# Patient Record
Sex: Male | Born: 1995 | Race: Black or African American | Hispanic: No | Marital: Single | Smoking: Never smoker
Health system: Southern US, Community
[De-identification: ages and names within clinical notes are randomized; demographics above are authoritative.]

## PROBLEM LIST (undated history)

## (undated) DIAGNOSIS — G43909 Migraine, unspecified, not intractable, without status migrainosus: Secondary | ICD-10-CM

## (undated) HISTORY — PX: CIRCUMCISION: SUR203

---
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
## 2016-03-12 ENCOUNTER — Ambulatory Visit: Payer: Medicaid Other | Attending: Internal Medicine | Admitting: Physical Therapy

## 2016-03-12 ENCOUNTER — Encounter: Payer: Self-pay | Admitting: Physical Therapy

## 2016-03-12 DIAGNOSIS — M25511 Pain in right shoulder: Secondary | ICD-10-CM | POA: Diagnosis present

## 2016-03-12 DIAGNOSIS — M6281 Muscle weakness (generalized): Secondary | ICD-10-CM | POA: Diagnosis present

## 2016-03-12 DIAGNOSIS — M25562 Pain in left knee: Secondary | ICD-10-CM | POA: Diagnosis present

## 2016-03-12 DIAGNOSIS — M25561 Pain in right knee: Secondary | ICD-10-CM | POA: Diagnosis present

## 2016-03-12 DIAGNOSIS — R262 Difficulty in walking, not elsewhere classified: Secondary | ICD-10-CM | POA: Insufficient documentation

## 2016-03-12 DIAGNOSIS — M25512 Pain in left shoulder: Secondary | ICD-10-CM
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
## 2016-03-27 ENCOUNTER — Ambulatory Visit: Payer: Medicaid Other | Admitting: Physical Therapy

## 2016-04-03 ENCOUNTER — Ambulatory Visit: Payer: Medicaid Other | Admitting: Physical Therapy

## 2016-04-03 DIAGNOSIS — M25511 Pain in right shoulder: Secondary | ICD-10-CM

## 2016-04-03 DIAGNOSIS — M25562 Pain in left knee: Secondary | ICD-10-CM

## 2016-04-03 DIAGNOSIS — M25561 Pain in right knee: Secondary | ICD-10-CM

## 2016-04-03 DIAGNOSIS — R262 Difficulty in walking, not elsewhere classified: Secondary | ICD-10-CM

## 2016-04-03 DIAGNOSIS — M25512 Pain in left shoulder: Secondary | ICD-10-CM

## 2016-04-03 DIAGNOSIS — M6281 Muscle weakness (generalized): Secondary | ICD-10-CM
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
## 2016-04-05 ENCOUNTER — Ambulatory Visit: Payer: Medicaid Other | Admitting: Physical Therapy

## 2016-04-05 DIAGNOSIS — M25511 Pain in right shoulder: Secondary | ICD-10-CM | POA: Diagnosis not present

## 2016-04-05 DIAGNOSIS — M6281 Muscle weakness (generalized): Secondary | ICD-10-CM

## 2016-04-05 DIAGNOSIS — M25561 Pain in right knee: Secondary | ICD-10-CM

## 2016-04-05 DIAGNOSIS — M25562 Pain in left knee: Secondary | ICD-10-CM

## 2016-04-05 DIAGNOSIS — M25512 Pain in left shoulder: Secondary | ICD-10-CM

## 2016-04-05 DIAGNOSIS — R262 Difficulty in walking, not elsewhere classified: Secondary | ICD-10-CM
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
## 2016-04-10 ENCOUNTER — Encounter: Payer: Self-pay | Admitting: Physical Therapy

## 2016-04-10 ENCOUNTER — Ambulatory Visit: Payer: Medicaid Other | Attending: Internal Medicine | Admitting: Physical Therapy

## 2016-04-10 DIAGNOSIS — R262 Difficulty in walking, not elsewhere classified: Secondary | ICD-10-CM

## 2016-04-10 DIAGNOSIS — M6281 Muscle weakness (generalized): Secondary | ICD-10-CM | POA: Diagnosis present

## 2016-04-10 DIAGNOSIS — M25511 Pain in right shoulder: Secondary | ICD-10-CM | POA: Diagnosis not present

## 2016-04-10 DIAGNOSIS — M25512 Pain in left shoulder: Secondary | ICD-10-CM | POA: Diagnosis present

## 2016-04-10 DIAGNOSIS — M25561 Pain in right knee: Secondary | ICD-10-CM

## 2016-04-10 DIAGNOSIS — M25562 Pain in left knee: Secondary | ICD-10-CM | POA: Diagnosis present
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
## 2016-04-12 ENCOUNTER — Ambulatory Visit: Payer: Medicaid Other | Admitting: Physical Therapy

## 2016-04-12 DIAGNOSIS — M25561 Pain in right knee: Secondary | ICD-10-CM

## 2016-04-12 DIAGNOSIS — R262 Difficulty in walking, not elsewhere classified: Secondary | ICD-10-CM

## 2016-04-12 DIAGNOSIS — M25562 Pain in left knee: Secondary | ICD-10-CM

## 2016-04-12 DIAGNOSIS — M25512 Pain in left shoulder: Secondary | ICD-10-CM

## 2016-04-12 DIAGNOSIS — M25511 Pain in right shoulder: Secondary | ICD-10-CM | POA: Diagnosis not present

## 2016-04-12 DIAGNOSIS — M6281 Muscle weakness (generalized): Secondary | ICD-10-CM
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
## 2016-04-17 ENCOUNTER — Encounter: Payer: Self-pay | Admitting: Physical Therapy

## 2016-04-17 ENCOUNTER — Ambulatory Visit: Payer: Medicaid Other | Admitting: Physical Therapy

## 2016-04-17 DIAGNOSIS — M25512 Pain in left shoulder: Secondary | ICD-10-CM

## 2016-04-17 DIAGNOSIS — M25562 Pain in left knee: Secondary | ICD-10-CM

## 2016-04-17 DIAGNOSIS — M25511 Pain in right shoulder: Secondary | ICD-10-CM

## 2016-04-17 DIAGNOSIS — M25561 Pain in right knee: Secondary | ICD-10-CM

## 2016-04-17 DIAGNOSIS — R262 Difficulty in walking, not elsewhere classified: Secondary | ICD-10-CM

## 2016-04-17 DIAGNOSIS — M6281 Muscle weakness (generalized): Secondary | ICD-10-CM
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
## 2016-04-19 ENCOUNTER — Ambulatory Visit: Payer: Medicaid Other | Admitting: Physical Therapy

## 2016-09-10 ENCOUNTER — Emergency Department (HOSPITAL_COMMUNITY)
Admission: EM | Admit: 2016-09-10 | Discharge: 2016-09-10 | Disposition: A | Discharge status: Home / Self Care | Payer: Medicaid Other | Attending: Emergency Medicine | Admitting: Emergency Medicine

## 2016-09-10 ENCOUNTER — Emergency Department (HOSPITAL_COMMUNITY): Payer: Medicaid Other

## 2016-09-10 ENCOUNTER — Encounter (HOSPITAL_COMMUNITY): Payer: Self-pay | Admitting: Emergency Medicine

## 2016-09-10 DIAGNOSIS — Y939 Activity, unspecified: Secondary | ICD-10-CM | POA: Insufficient documentation

## 2016-09-10 DIAGNOSIS — S199XXA Unspecified injury of neck, initial encounter: Secondary | ICD-10-CM | POA: Diagnosis not present

## 2016-09-10 DIAGNOSIS — Y999 Unspecified external cause status: Secondary | ICD-10-CM | POA: Insufficient documentation

## 2016-09-10 DIAGNOSIS — Y92410 Unspecified street and highway as the place of occurrence of the external cause: Secondary | ICD-10-CM | POA: Insufficient documentation

## 2016-09-10 DIAGNOSIS — M791 Myalgia: Secondary | ICD-10-CM | POA: Diagnosis not present

## 2016-09-10 DIAGNOSIS — S0990XA Unspecified injury of head, initial encounter: Secondary | ICD-10-CM | POA: Diagnosis not present

## 2016-09-10 DIAGNOSIS — R1030 Lower abdominal pain, unspecified: Secondary | ICD-10-CM | POA: Insufficient documentation

## 2016-09-10 DIAGNOSIS — M7918 Myalgia, other site: Secondary | ICD-10-CM

## 2016-09-10 DIAGNOSIS — M546 Pain in thoracic spine: Secondary | ICD-10-CM | POA: Diagnosis not present

## 2016-09-10 MED ORDER — IOPAMIDOL (ISOVUE-300) INJECTION 61%
INTRAVENOUS | Status: AC
  Administered 2016-09-10: 100 mL
  Filled 2016-09-10: qty 100

## 2016-09-10 MED ORDER — IBUPROFEN 800 MG PO TABS
800.0000 mg | ORAL_TABLET | Freq: Once | ORAL | Status: AC
  Administered 2016-09-10: 800 mg via ORAL
  Filled 2016-09-10: qty 1

## 2016-09-10 MED ORDER — HYDROCODONE-ACETAMINOPHEN 5-325 MG PO TABS
1.0000 | ORAL_TABLET | Freq: Once | ORAL | Status: AC
  Administered 2016-09-10: 1 via ORAL
  Filled 2016-09-10: qty 1

## 2016-09-10 MED ORDER — ONDANSETRON 4 MG PO TBDP
4.0000 mg | ORAL_TABLET | Freq: Once | ORAL | Status: AC | PRN
  Administered 2016-09-10: 4 mg via ORAL

## 2016-09-10 MED ORDER — ONDANSETRON 4 MG PO TBDP
ORAL_TABLET | ORAL | Status: AC
  Filled 2016-09-10: qty 1

## 2016-09-10 MED ORDER — IBUPROFEN 600 MG PO TABS: 600.0000 mg | ORAL_TABLET | Freq: Four times a day (QID) | ORAL | 0 refills | Status: DC | PRN

## 2016-09-10 MED ORDER — CYCLOBENZAPRINE HCL 10 MG PO TABS: 10.0000 mg | ORAL_TABLET | Freq: Three times a day (TID) | ORAL | 0 refills | Status: DC | PRN

## 2016-09-10 NOTE — Discharge Instructions (Signed)
Read the information below.  Use the prescribed medication as directed.  Please discuss all new medications with your pharmacist.  You may return to the Emergency Department at any time for worsening condition or any new symptoms that concern you.    °

## 2016-09-10 NOTE — ED Notes (Signed)
Patient transported to X-ray 

## 2016-09-10 NOTE — ED Notes (Signed)
Got patient hooked up to the monitor waiting on provider patient is resting got a warm blanket
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
## 2016-09-16 ENCOUNTER — Emergency Department (HOSPITAL_COMMUNITY)
Admission: EM | Admit: 2016-09-16 | Discharge: 2016-09-16 | Disposition: A | Discharge status: Home / Self Care | Payer: No Typology Code available for payment source | Attending: Emergency Medicine | Admitting: Emergency Medicine

## 2016-09-16 ENCOUNTER — Encounter (HOSPITAL_COMMUNITY): Payer: Self-pay | Admitting: Emergency Medicine

## 2016-09-16 DIAGNOSIS — S3991XD Unspecified injury of abdomen, subsequent encounter: Secondary | ICD-10-CM | POA: Insufficient documentation

## 2016-09-16 DIAGNOSIS — R1030 Lower abdominal pain, unspecified: Secondary | ICD-10-CM

## 2016-09-16 DIAGNOSIS — M7918 Myalgia, other site: Secondary | ICD-10-CM

## 2016-09-16 MED ORDER — OXYCODONE-ACETAMINOPHEN 5-325 MG PO TABS
1.0000 | ORAL_TABLET | Freq: Once | ORAL | Status: AC
  Administered 2016-09-16: 1 via ORAL
  Filled 2016-09-16: qty 1

## 2016-09-16 MED ORDER — METHOCARBAMOL 500 MG PO TABS: 500.0000 mg | ORAL_TABLET | Freq: Two times a day (BID) | ORAL | 0 refills | Status: DC
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
## 2017-12-03 ENCOUNTER — Emergency Department (HOSPITAL_COMMUNITY)
Admission: EM | Admit: 2017-12-03 | Discharge: 2017-12-03 | Disposition: A | Discharge status: Home / Self Care | Payer: Medicaid Other | Attending: Emergency Medicine | Admitting: Emergency Medicine

## 2017-12-03 ENCOUNTER — Encounter (HOSPITAL_COMMUNITY): Payer: Self-pay | Admitting: Emergency Medicine

## 2017-12-03 ENCOUNTER — Emergency Department (HOSPITAL_COMMUNITY): Payer: Medicaid Other

## 2017-12-03 DIAGNOSIS — Z79899 Other long term (current) drug therapy: Secondary | ICD-10-CM | POA: Diagnosis not present

## 2017-12-03 DIAGNOSIS — R05 Cough: Secondary | ICD-10-CM | POA: Diagnosis present

## 2017-12-03 DIAGNOSIS — B9789 Other viral agents as the cause of diseases classified elsewhere: Secondary | ICD-10-CM | POA: Diagnosis not present

## 2017-12-03 DIAGNOSIS — J069 Acute upper respiratory infection, unspecified: Secondary | ICD-10-CM | POA: Diagnosis not present

## 2017-12-03 MED ORDER — BENZONATATE 100 MG PO CAPS: 200.0000 mg | ORAL_CAPSULE | Freq: Three times a day (TID) | ORAL | 0 refills | Status: DC

## 2017-12-03 MED ORDER — BENZONATATE 100 MG PO CAPS
200.0000 mg | ORAL_CAPSULE | Freq: Once | ORAL | Status: AC
  Administered 2017-12-03: 200 mg via ORAL
  Filled 2017-12-03: qty 2

## 2017-12-03 NOTE — Discharge Instructions (Addendum)
Return to ED for worsening symptoms, coughing up blood, chest pain, shortness of breath, leg swelling.

## 2017-12-03 NOTE — ED Notes (Signed)
Patient transported to radiology

## 2017-12-03 NOTE — ED Triage Notes (Signed)
Pt has non-productive cough for 1 week.  Tried OTC meds but they don't work

## 2017-12-03 NOTE — ED Notes (Signed)
Patient able to ambulate independently  Patient refused any additional vitals before d/c
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
## 2017-12-04 ENCOUNTER — Telehealth: Payer: Self-pay | Admitting: *Deleted

## 2017-12-04 NOTE — Telephone Encounter (Signed)
Pharmacy called related to Rx: [facility] illegibile[facility] D took as verbal.  No further EDCM needs identified at this time.

## 2018-06-23 ENCOUNTER — Emergency Department (HOSPITAL_COMMUNITY)
Admission: EM | Admit: 2018-06-23 | Discharge: 2018-06-23 | Disposition: A | Discharge status: Home / Self Care | Payer: Medicaid Other | Attending: Emergency Medicine | Admitting: Emergency Medicine

## 2018-06-23 ENCOUNTER — Encounter (HOSPITAL_COMMUNITY): Payer: Self-pay

## 2018-06-23 DIAGNOSIS — R1084 Generalized abdominal pain: Secondary | ICD-10-CM

## 2018-06-23 DIAGNOSIS — R112 Nausea with vomiting, unspecified: Secondary | ICD-10-CM | POA: Diagnosis not present

## 2018-06-23 LAB — COMPREHENSIVE METABOLIC PANEL
ALT: 17 U/L (ref 0–44)
ANION GAP: 10 (ref 5–15)
AST: 22 U/L (ref 15–41)
Albumin: 4.4 g/dL (ref 3.5–5.0)
Alkaline Phosphatase: 97 U/L (ref 38–126)
BILIRUBIN TOTAL: 1.3 mg/dL — AB (ref 0.3–1.2)
BUN: <5 mg/dL — ABNORMAL LOW (ref 6–20)
CO2: 26 mmol/L (ref 22–32)
Calcium: 9.4 mg/dL (ref 8.9–10.3)
Chloride: 103 mmol/L (ref 98–111)
Creatinine, Ser: 0.89 mg/dL (ref 0.61–1.24)
GFR calc Af Amer: >60 mL/min (ref >60)
GFR calc non Af Amer: >60 mL/min (ref >60)
Glucose, Bld: 91 mg/dL (ref 70–99)
POTASSIUM: 4.1 mmol/L (ref 3.5–5.1)
Sodium: 139 mmol/L (ref 135–145)
TOTAL PROTEIN: 7.5 g/dL (ref 6.5–8.1)

## 2018-06-23 LAB — CBC
HCT: 48.9 % (ref 39.0–52.0)
HEMOGLOBIN: 15 g/dL (ref 13.0–17.0)
MCH: 27.5 pg (ref 26.0–34.0)
MCHC: 30.7 g/dL (ref 30.0–36.0)
MCV: 89.7 fL (ref 80.0–100.0)
Platelets: 195 10*3/uL (ref 150–400)
RBC: 5.45 MIL/uL (ref 4.22–5.81)
RDW: 12.4 % (ref 11.5–15.5)
WBC: 15.1 10*3/uL — AB (ref 4.0–10.5)
nRBC: 0 % (ref 0.0–0.2)

## 2018-06-23 LAB — LIPASE, BLOOD: Lipase: 17 U/L (ref 11–51)

## 2018-06-23 MED ORDER — SODIUM CHLORIDE 0.9% FLUSH
3.0000 mL | Freq: Once | INTRAVENOUS | Status: AC
  Administered 2018-06-23: 3 mL via INTRAVENOUS

## 2018-06-23 MED ORDER — PROMETHAZINE HCL 25 MG/ML IJ SOLN
12.5000 mg | Freq: Once | INTRAMUSCULAR | Status: AC
  Administered 2018-06-23: 12.5 mg via INTRAVENOUS
  Filled 2018-06-23: qty 1

## 2018-06-23 MED ORDER — SODIUM CHLORIDE 0.9 % IV BOLUS
1000.0000 mL | Freq: Once | INTRAVENOUS | Status: AC
  Administered 2018-06-23: 1000 mL via INTRAVENOUS

## 2018-06-23 MED ORDER — ONDANSETRON 4 MG PO TBDP
4.0000 mg | ORAL_TABLET | Freq: Once | ORAL | Status: AC | PRN
  Administered 2018-06-23: 4 mg via ORAL
  Filled 2018-06-23: qty 1

## 2018-06-23 MED ORDER — DICYCLOMINE HCL 20 MG PO TABS: 20.0000 mg | ORAL_TABLET | Freq: Three times a day (TID) | ORAL | 0 refills | Status: DC | PRN

## 2018-06-23 MED ORDER — ONDANSETRON 4 MG PO TBDP: 4.0000 mg | ORAL_TABLET | Freq: Three times a day (TID) | ORAL | 0 refills | Status: DC | PRN

## 2018-06-23 MED ORDER — METOCLOPRAMIDE HCL 5 MG/ML IJ SOLN
10.0000 mg | Freq: Once | INTRAMUSCULAR | Status: AC
  Administered 2018-06-23: 10 mg via INTRAVENOUS
  Filled 2018-06-23: qty 2

## 2018-06-23 NOTE — Discharge Instructions (Signed)
You have been seen in the Emergency Department (ED) for abdominal pain.  Your evaluation did not identify a clear cause of your symptoms but was generally reassuring.  You likely have food poisoning.   Please follow up as instructed above regarding todays emergent visit and the symptoms that are bothering you.  Return to the ED if your abdominal pain worsens or fails to improve, you develop bloody vomiting, bloody diarrhea, you are unable to tolerate fluids due to vomiting, fever greater than 101, or other symptoms that concern you.

## 2018-06-23 NOTE — ED Notes (Signed)
Patient verbalizes understanding of discharge instructions. Opportunity for questioning and answers were provided. Armband removed by staff, pt discharged from ED. Pt wheeled to lobby and taken home by family 

## 2018-06-23 NOTE — ED Notes (Signed)
ED Provider at bedside. 

## 2018-06-23 NOTE — ED Notes (Signed)
Per family, pt tolerating PO fluids.
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
## 2018-07-25 ENCOUNTER — Other Ambulatory Visit: Payer: Self-pay

## 2018-07-25 DIAGNOSIS — R6889 Other general symptoms and signs: Secondary | ICD-10-CM

## 2018-07-25 NOTE — Progress Notes (Unsigned)
lab

## 2018-07-31 LAB — NOVEL CORONAVIRUS, NAA: SARS-CoV-2, NAA: NOT DETECTED

## 2019-03-08 ENCOUNTER — Other Ambulatory Visit: Payer: Self-pay

## 2019-03-08 ENCOUNTER — Emergency Department (HOSPITAL_COMMUNITY): Payer: Medicaid Other

## 2019-03-08 ENCOUNTER — Encounter (HOSPITAL_COMMUNITY): Payer: Self-pay | Admitting: Emergency Medicine

## 2019-03-08 ENCOUNTER — Emergency Department (HOSPITAL_COMMUNITY)
Admission: EM | Admit: 2019-03-08 | Discharge: 2019-03-08 | Disposition: A | Discharge status: Home / Self Care | Payer: Medicaid Other | Attending: Emergency Medicine | Admitting: Emergency Medicine

## 2019-03-08 DIAGNOSIS — Z79899 Other long term (current) drug therapy: Secondary | ICD-10-CM | POA: Diagnosis not present

## 2019-03-08 DIAGNOSIS — Y9367 Activity, basketball: Secondary | ICD-10-CM | POA: Diagnosis not present

## 2019-03-08 DIAGNOSIS — Y929 Unspecified place or not applicable: Secondary | ICD-10-CM | POA: Insufficient documentation

## 2019-03-08 DIAGNOSIS — W010XXA Fall on same level from slipping, tripping and stumbling without subsequent striking against object, initial encounter: Secondary | ICD-10-CM | POA: Diagnosis not present

## 2019-03-08 DIAGNOSIS — S99922A Unspecified injury of left foot, initial encounter: Secondary | ICD-10-CM | POA: Insufficient documentation

## 2019-03-08 DIAGNOSIS — Y999 Unspecified external cause status: Secondary | ICD-10-CM | POA: Insufficient documentation

## 2019-03-08 MED ORDER — IBUPROFEN 400 MG PO TABS
600.0000 mg | ORAL_TABLET | Freq: Once | ORAL | Status: AC
  Administered 2019-03-08: 600 mg via ORAL
  Filled 2019-03-08: qty 1

## 2019-03-08 NOTE — ED Notes (Signed)
Patient transported to X-ray 

## 2019-03-08 NOTE — ED Triage Notes (Signed)
Patient reports injury to left ankle while playing basketball this morning with pain and mild swelling .

## 2019-03-08 NOTE — Discharge Instructions (Signed)
Thank you for allowing me to care for you today in the Emergency Department.   Use the crutches as needed until you can put weight on your left foot without considerable pain.  Use the Ace wrap to provide compression to the area this should help with pain.  Apply ice packs for 15 to 20 minutes as frequently as needed throughout the day.  When you are sitting, elevate your right leg so that your toes are at or above the level of your nose to help with pain and swelling.  Take 650 mg of Tylenol or 600 mg of ibuprofen with food every 6 hours for pain.  You can alternate between these 2 medications every 3 hours if your pain returns.  For instance, you can take Tylenol at noon, followed by a dose of ibuprofen at 3, followed by second dose of Tylenol and 6.  Follow-up with primary care in 1 week if your symptoms do not significantly improve.  Return to the emergency department if you have another fall or injury, if your toes turn blue, if you develop significantly worsening swelling and redness to the lower leg, numbness or weakness, or other new, concerning symptoms.
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
## 2019-03-08 NOTE — ED Notes (Signed)
Pt returned from XR via wheelchair.

## 2019-07-18 ENCOUNTER — Emergency Department (HOSPITAL_COMMUNITY): Payer: Medicaid Other

## 2019-07-18 ENCOUNTER — Emergency Department (HOSPITAL_COMMUNITY)
Admission: EM | Admit: 2019-07-18 | Discharge: 2019-07-18 | Disposition: A | Discharge status: Home / Self Care | Payer: Medicaid Other | Attending: Emergency Medicine | Admitting: Emergency Medicine

## 2019-07-18 ENCOUNTER — Encounter (HOSPITAL_COMMUNITY): Payer: Self-pay

## 2019-07-18 ENCOUNTER — Other Ambulatory Visit: Payer: Self-pay

## 2019-07-18 DIAGNOSIS — M25561 Pain in right knee: Secondary | ICD-10-CM

## 2019-07-18 DIAGNOSIS — Y999 Unspecified external cause status: Secondary | ICD-10-CM | POA: Diagnosis not present

## 2019-07-18 DIAGNOSIS — R079 Chest pain, unspecified: Secondary | ICD-10-CM | POA: Diagnosis present

## 2019-07-18 DIAGNOSIS — Y92410 Unspecified street and highway as the place of occurrence of the external cause: Secondary | ICD-10-CM | POA: Diagnosis not present

## 2019-07-18 DIAGNOSIS — R1010 Upper abdominal pain, unspecified: Secondary | ICD-10-CM

## 2019-07-18 DIAGNOSIS — Y939 Activity, unspecified: Secondary | ICD-10-CM | POA: Insufficient documentation

## 2019-07-18 LAB — CBC
HCT: 46.1 % (ref 39.0–52.0)
Hemoglobin: 14.3 g/dL (ref 13.0–17.0)
MCH: 25.2 pg — ABNORMAL LOW (ref 26.0–34.0)
MCHC: 31 g/dL (ref 30.0–36.0)
MCV: 81.2 fL (ref 80.0–100.0)
Platelets: 316 10*3/uL (ref 150–400)
RBC: 5.68 MIL/uL (ref 4.22–5.81)
RDW: 15 % (ref 11.5–15.5)
WBC: 7.5 10*3/uL (ref 4.0–10.5)
nRBC: 0 % (ref 0.0–0.2)

## 2019-07-18 LAB — I-STAT CHEM 8, ED
BUN: 11 mg/dL (ref 6–20)
Calcium, Ion: 1.24 mmol/L (ref 1.15–1.40)
Chloride: 102 mmol/L (ref 98–111)
Creatinine, Ser: 0.9 mg/dL (ref 0.61–1.24)
Glucose, Bld: 87 mg/dL (ref 70–99)
HCT: 49 % (ref 39.0–52.0)
Hemoglobin: 16.7 g/dL (ref 13.0–17.0)
Potassium: 4.3 mmol/L (ref 3.5–5.1)
Sodium: 140 mmol/L (ref 135–145)
TCO2: 29 mmol/L (ref 22–32)

## 2019-07-18 MED ORDER — SODIUM CHLORIDE 0.9% FLUSH: 3.0000 mL | Freq: Once | INTRAVENOUS | Status: DC

## 2019-07-18 MED ORDER — IBUPROFEN 600 MG PO TABS: 600.0000 mg | ORAL_TABLET | Freq: Four times a day (QID) | ORAL | 0 refills | Status: DC | PRN

## 2019-07-18 NOTE — ED Notes (Signed)
Pt was discharged from the ED. Pt read and understood discharge paperwork. Pt had vital signs completed. Pt conscious, breathing, and A&Ox4. No distress noted. Pt speaking in complete sentences. Pt ambulated out of the ED with a smooth and steady gait. E-signature not available.  
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
## 2019-11-02 ENCOUNTER — Emergency Department (HOSPITAL_COMMUNITY): Payer: Medicaid Other

## 2019-11-02 ENCOUNTER — Emergency Department (HOSPITAL_COMMUNITY)
Admission: EM | Admit: 2019-11-02 | Discharge: 2019-11-02 | Disposition: A | Discharge status: Home / Self Care | Payer: Medicaid Other | Attending: Emergency Medicine | Admitting: Emergency Medicine

## 2019-11-02 ENCOUNTER — Other Ambulatory Visit: Payer: Self-pay

## 2019-11-02 DIAGNOSIS — R519 Headache, unspecified: Secondary | ICD-10-CM | POA: Insufficient documentation

## 2019-11-02 DIAGNOSIS — M546 Pain in thoracic spine: Secondary | ICD-10-CM | POA: Insufficient documentation

## 2019-11-02 DIAGNOSIS — M542 Cervicalgia: Secondary | ICD-10-CM | POA: Diagnosis not present

## 2019-11-02 DIAGNOSIS — R1012 Left upper quadrant pain: Secondary | ICD-10-CM | POA: Insufficient documentation

## 2019-11-02 DIAGNOSIS — R0789 Other chest pain: Secondary | ICD-10-CM

## 2019-11-02 MED ORDER — CYCLOBENZAPRINE HCL 10 MG PO TABS
5.0000 mg | ORAL_TABLET | Freq: Once | ORAL | Status: AC
  Administered 2019-11-02: 5 mg via ORAL
  Filled 2019-11-02: qty 1

## 2019-11-02 MED ORDER — NAPROXEN 250 MG PO TABS
500.0000 mg | ORAL_TABLET | Freq: Once | ORAL | Status: AC
  Administered 2019-11-02: 500 mg via ORAL
  Filled 2019-11-02: qty 2

## 2019-11-02 MED ORDER — NAPROXEN 500 MG PO TABS: 500.0000 mg | ORAL_TABLET | Freq: Three times a day (TID) | ORAL | 0 refills | Status: AC

## 2019-11-02 MED ORDER — CYCLOBENZAPRINE HCL 10 MG PO TABS
10.0000 mg | ORAL_TABLET | Freq: Three times a day (TID) | ORAL | 0 refills | Status: AC
Start: 2019-11-02 — End: 2019-11-09

## 2019-11-02 MED ORDER — LIDOCAINE 5 % EX PTCH
1.0000 | MEDICATED_PATCH | CUTANEOUS | Status: DC
  Administered 2019-11-02: 1 via TRANSDERMAL
  Filled 2019-11-02: qty 1

## 2019-11-02 MED ORDER — ACETAMINOPHEN 500 MG PO TABS
1000.0000 mg | ORAL_TABLET | Freq: Once | ORAL | Status: AC
  Administered 2019-11-02: 1000 mg via ORAL
  Filled 2019-11-02: qty 2

## 2019-11-02 NOTE — ED Notes (Signed)
Patient verbalizes understanding of discharge instructions . Opportunity for questions and answers were provided . Armband removed by staff ,Pt discharged from ED. W/C  offered at D/C  and Declined W/C at D/C and was escorted to lobby by RN.  
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
## 2019-11-02 NOTE — ED Notes (Signed)
Per provider pt does not want to stay for further treatment

## 2019-11-02 NOTE — ED Notes (Signed)
Patient transported to X-ray 
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
## 2020-06-02 ENCOUNTER — Emergency Department (HOSPITAL_COMMUNITY): Payer: No Typology Code available for payment source

## 2020-06-02 ENCOUNTER — Other Ambulatory Visit: Payer: Self-pay

## 2020-06-02 ENCOUNTER — Encounter (HOSPITAL_COMMUNITY): Payer: Self-pay | Admitting: Emergency Medicine

## 2020-06-02 ENCOUNTER — Emergency Department (HOSPITAL_COMMUNITY)
Admission: EM | Admit: 2020-06-02 | Discharge: 2020-06-02 | Disposition: A | Discharge status: Home / Self Care | Payer: No Typology Code available for payment source | Attending: Emergency Medicine | Admitting: Emergency Medicine

## 2020-06-02 DIAGNOSIS — M542 Cervicalgia: Secondary | ICD-10-CM | POA: Insufficient documentation

## 2020-06-02 DIAGNOSIS — G4700 Insomnia, unspecified: Secondary | ICD-10-CM | POA: Diagnosis not present

## 2020-06-02 DIAGNOSIS — R42 Dizziness and giddiness: Secondary | ICD-10-CM | POA: Insufficient documentation

## 2020-06-02 DIAGNOSIS — R531 Weakness: Secondary | ICD-10-CM | POA: Diagnosis not present

## 2020-06-02 DIAGNOSIS — Y92410 Unspecified street and highway as the place of occurrence of the external cause: Secondary | ICD-10-CM | POA: Insufficient documentation

## 2020-06-02 DIAGNOSIS — R519 Headache, unspecified: Secondary | ICD-10-CM | POA: Insufficient documentation

## 2020-06-02 MED ORDER — KETOROLAC TROMETHAMINE 60 MG/2ML IM SOLN
60.0000 mg | Freq: Once | INTRAMUSCULAR | Status: AC
  Administered 2020-06-02: 60 mg via INTRAMUSCULAR
  Filled 2020-06-02: qty 2
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
## 2021-06-08 ENCOUNTER — Other Ambulatory Visit: Payer: Self-pay

## 2021-06-08 ENCOUNTER — Emergency Department (HOSPITAL_COMMUNITY): Payer: Medicaid Other

## 2021-06-08 ENCOUNTER — Emergency Department (HOSPITAL_COMMUNITY)
Admission: EM | Admit: 2021-06-08 | Discharge: 2021-06-08 | Disposition: A | Discharge status: Home / Self Care | Payer: Medicaid Other | Attending: Emergency Medicine | Admitting: Emergency Medicine

## 2021-06-08 ENCOUNTER — Encounter (HOSPITAL_COMMUNITY): Payer: Self-pay | Admitting: Emergency Medicine

## 2021-06-08 DIAGNOSIS — Y92410 Unspecified street and highway as the place of occurrence of the external cause: Secondary | ICD-10-CM | POA: Diagnosis not present

## 2021-06-08 DIAGNOSIS — S8000XA Contusion of unspecified knee, initial encounter: Secondary | ICD-10-CM

## 2021-06-08 DIAGNOSIS — M25562 Pain in left knee: Secondary | ICD-10-CM | POA: Diagnosis not present

## 2021-06-08 DIAGNOSIS — M25561 Pain in right knee: Secondary | ICD-10-CM | POA: Insufficient documentation

## 2021-06-08 DIAGNOSIS — R0789 Other chest pain: Secondary | ICD-10-CM | POA: Insufficient documentation

## 2021-06-08 DIAGNOSIS — S301XXA Contusion of abdominal wall, initial encounter: Secondary | ICD-10-CM

## 2021-06-08 DIAGNOSIS — Z20822 Contact with and (suspected) exposure to covid-19: Secondary | ICD-10-CM | POA: Insufficient documentation

## 2021-06-08 DIAGNOSIS — R519 Headache, unspecified: Secondary | ICD-10-CM | POA: Diagnosis not present

## 2021-06-08 DIAGNOSIS — R1030 Lower abdominal pain, unspecified: Secondary | ICD-10-CM | POA: Diagnosis not present

## 2021-06-08 LAB — COMPREHENSIVE METABOLIC PANEL
ALT: 15 U/L (ref 0–44)
AST: 16 U/L (ref 15–41)
Albumin: 4.1 g/dL (ref 3.5–5.0)
Alkaline Phosphatase: 137 U/L — ABNORMAL HIGH (ref 38–126)
Anion gap: 7 (ref 5–15)
BUN: 8 mg/dL (ref 6–20)
CO2: 27 mmol/L (ref 22–32)
Calcium: 9.4 mg/dL (ref 8.9–10.3)
Chloride: 105 mmol/L (ref 98–111)
Creatinine, Ser: 0.91 mg/dL (ref 0.61–1.24)
GFR, Estimated: >60 mL/min (ref >60)
Glucose, Bld: 94 mg/dL (ref 70–99)
Potassium: 4.2 mmol/L (ref 3.5–5.1)
Sodium: 139 mmol/L (ref 135–145)
Total Bilirubin: 1 mg/dL (ref 0.3–1.2)
Total Protein: 7.1 g/dL (ref 6.5–8.1)

## 2021-06-08 LAB — LIPASE, BLOOD: Lipase: 25 U/L (ref 11–51)

## 2021-06-08 LAB — CBC
HCT: 43.1 % (ref 39.0–52.0)
Hemoglobin: 13.4 g/dL (ref 13.0–17.0)
MCH: 27.2 pg (ref 26.0–34.0)
MCHC: 31.1 g/dL (ref 30.0–36.0)
MCV: 87.4 fL (ref 80.0–100.0)
Platelets: 203 10*3/uL (ref 150–400)
RBC: 4.93 MIL/uL (ref 4.22–5.81)
RDW: 12.3 % (ref 11.5–15.5)
WBC: 4.5 10*3/uL (ref 4.0–10.5)
nRBC: 0 % (ref 0.0–0.2)

## 2021-06-08 LAB — I-STAT CHEM 8, ED
BUN: 11 mg/dL (ref 6–20)
Calcium, Ion: 1.19 mmol/L (ref 1.15–1.40)
Chloride: 103 mmol/L (ref 98–111)
Creatinine, Ser: 0.7 mg/dL (ref 0.61–1.24)
Glucose, Bld: 90 mg/dL (ref 70–99)
HCT: 45 % (ref 39.0–52.0)
Hemoglobin: 15.3 g/dL (ref 13.0–17.0)
Potassium: 4.2 mmol/L (ref 3.5–5.1)
Sodium: 139 mmol/L (ref 135–145)
TCO2: 27 mmol/L (ref 22–32)

## 2021-06-08 LAB — URINALYSIS, ROUTINE W REFLEX MICROSCOPIC
Bilirubin Urine: NEGATIVE
Glucose, UA: NEGATIVE mg/dL
Hgb urine dipstick: NEGATIVE
Ketones, ur: NEGATIVE mg/dL
Leukocytes,Ua: NEGATIVE
Nitrite: NEGATIVE
Protein, ur: NEGATIVE mg/dL
Specific Gravity, Urine: 1.025 (ref 1.005–1.030)
pH: 6 (ref 5.0–8.0)

## 2021-06-08 LAB — SAMPLE TO BLOOD BANK

## 2021-06-08 LAB — RESP PANEL BY RT-PCR (FLU A&B, COVID) ARPGX2
Influenza A by PCR: NEGATIVE
Influenza B by PCR: NEGATIVE
SARS Coronavirus 2 by RT PCR: NEGATIVE

## 2021-06-08 MED ORDER — MORPHINE SULFATE (PF) 4 MG/ML IV SOLN
4.0000 mg | Freq: Once | INTRAVENOUS | Status: AC
  Administered 2021-06-08: 4 mg via INTRAVENOUS
  Filled 2021-06-08: qty 1

## 2021-06-08 MED ORDER — TRAMADOL HCL 50 MG PO TABS: 50.0000 mg | ORAL_TABLET | Freq: Four times a day (QID) | ORAL | 0 refills | Status: DC | PRN

## 2021-06-08 MED ORDER — ONDANSETRON HCL 4 MG/2ML IJ SOLN
4.0000 mg | Freq: Once | INTRAMUSCULAR | Status: AC
  Administered 2021-06-08: 4 mg via INTRAVENOUS
  Filled 2021-06-08: qty 2

## 2021-06-08 MED ORDER — IOHEXOL 300 MG/ML  SOLN
100.0000 mL | Freq: Once | INTRAMUSCULAR | Status: AC | PRN
  Administered 2021-06-08: 100 mL via INTRAVENOUS
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
## 2021-06-08 NOTE — Discharge Instructions (Addendum)
You CT scans and x-rays did not show any bleeding or fractures.  You will be stiff and sore for several days.  Take ibuprofen for pain and tramadol for severe pain  See your doctor for follow-up  Return to ER if you have worse abdominal pain or chest pain or headache or vomiting

## 2021-06-08 NOTE — ED Notes (Signed)
Pt discharged and ambulated out of the ED without difficulty. 
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
## 2022-03-28 ENCOUNTER — Encounter (HOSPITAL_BASED_OUTPATIENT_CLINIC_OR_DEPARTMENT_OTHER): Payer: Self-pay | Admitting: Emergency Medicine

## 2022-03-28 ENCOUNTER — Other Ambulatory Visit: Payer: Self-pay

## 2022-03-28 ENCOUNTER — Emergency Department (HOSPITAL_BASED_OUTPATIENT_CLINIC_OR_DEPARTMENT_OTHER): Payer: No Typology Code available for payment source

## 2022-03-28 ENCOUNTER — Emergency Department (HOSPITAL_BASED_OUTPATIENT_CLINIC_OR_DEPARTMENT_OTHER)
Admission: EM | Admit: 2022-03-28 | Discharge: 2022-03-28 | Disposition: A | Discharge status: Home / Self Care | Payer: No Typology Code available for payment source | Attending: Emergency Medicine | Admitting: Emergency Medicine

## 2022-03-28 DIAGNOSIS — Y92410 Unspecified street and highway as the place of occurrence of the external cause: Secondary | ICD-10-CM | POA: Diagnosis not present

## 2022-03-28 DIAGNOSIS — S022XXA Fracture of nasal bones, initial encounter for closed fracture: Secondary | ICD-10-CM | POA: Diagnosis not present

## 2022-03-28 DIAGNOSIS — S0992XA Unspecified injury of nose, initial encounter: Secondary | ICD-10-CM | POA: Diagnosis present

## 2022-03-28 DIAGNOSIS — R519 Headache, unspecified: Secondary | ICD-10-CM | POA: Insufficient documentation

## 2022-03-28 DIAGNOSIS — R1084 Generalized abdominal pain: Secondary | ICD-10-CM | POA: Diagnosis not present

## 2022-03-28 LAB — CBC WITH DIFFERENTIAL/PLATELET
Abs Immature Granulocytes: 0.06 10*3/uL (ref 0.00–0.07)
Basophils Absolute: 0 10*3/uL (ref 0.0–0.1)
Basophils Relative: 0 %
Eosinophils Absolute: 0 10*3/uL (ref 0.0–0.5)
Eosinophils Relative: 1 %
HCT: 44.1 % (ref 39.0–52.0)
Hemoglobin: 14.3 g/dL (ref 13.0–17.0)
Immature Granulocytes: 1 %
Lymphocytes Relative: 15 %
Lymphs Abs: 1.3 10*3/uL (ref 0.7–4.0)
MCH: 28 pg (ref 26.0–34.0)
MCHC: 32.4 g/dL (ref 30.0–36.0)
MCV: 86.3 fL (ref 80.0–100.0)
Monocytes Absolute: 0.7 10*3/uL (ref 0.1–1.0)
Monocytes Relative: 8 %
Neutro Abs: 6.4 10*3/uL (ref 1.7–7.7)
Neutrophils Relative %: 75 %
Platelets: 215 10*3/uL (ref 150–400)
RBC: 5.11 MIL/uL (ref 4.22–5.81)
RDW: 12.8 % (ref 11.5–15.5)
WBC: 8.5 10*3/uL (ref 4.0–10.5)
nRBC: 0 % (ref 0.0–0.2)

## 2022-03-28 LAB — COMPREHENSIVE METABOLIC PANEL
ALT: 17 U/L (ref 0–44)
AST: 18 U/L (ref 15–41)
Albumin: 4.7 g/dL (ref 3.5–5.0)
Alkaline Phosphatase: 125 U/L (ref 38–126)
Anion gap: 10 (ref 5–15)
BUN: 13 mg/dL (ref 6–20)
CO2: 28 mmol/L (ref 22–32)
Calcium: 9.6 mg/dL (ref 8.9–10.3)
Chloride: 100 mmol/L (ref 98–111)
Creatinine, Ser: 0.96 mg/dL (ref 0.61–1.24)
GFR, Estimated: >60 mL/min (ref >60)
Glucose, Bld: 122 mg/dL — ABNORMAL HIGH (ref 70–99)
Potassium: 3.7 mmol/L (ref 3.5–5.1)
Sodium: 138 mmol/L (ref 135–145)
Total Bilirubin: 0.8 mg/dL (ref 0.3–1.2)
Total Protein: 8 g/dL (ref 6.5–8.1)

## 2022-03-28 LAB — ETHANOL: Alcohol, Ethyl (B): <10 mg/dL (ref <10)

## 2022-03-28 MED ORDER — METHOCARBAMOL 500 MG PO TABS: 500.0000 mg | ORAL_TABLET | Freq: Two times a day (BID) | ORAL | 0 refills | Status: AC

## 2022-03-28 MED ORDER — KETOROLAC TROMETHAMINE 30 MG/ML IJ SOLN
30.0000 mg | Freq: Once | INTRAMUSCULAR | Status: AC
  Administered 2022-03-28: 30 mg via INTRAVENOUS
  Filled 2022-03-28: qty 1

## 2022-03-28 MED ORDER — IOHEXOL 300 MG/ML  SOLN
100.0000 mL | Freq: Once | INTRAMUSCULAR | Status: AC | PRN
  Administered 2022-03-28: 100 mL via INTRAVENOUS

## 2022-03-28 MED ORDER — NAPROXEN 500 MG PO TABS: 500.0000 mg | ORAL_TABLET | Freq: Two times a day (BID) | ORAL | 0 refills | Status: AC
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
## 2022-03-31 ENCOUNTER — Emergency Department (HOSPITAL_COMMUNITY): Payer: Medicaid Other

## 2022-03-31 ENCOUNTER — Emergency Department (HOSPITAL_COMMUNITY)
Admission: EM | Admit: 2022-03-31 | Discharge: 2022-03-31 | Disposition: A | Discharge status: Home / Self Care | Payer: Medicaid Other | Attending: Emergency Medicine | Admitting: Emergency Medicine

## 2022-03-31 DIAGNOSIS — R519 Headache, unspecified: Secondary | ICD-10-CM | POA: Diagnosis present

## 2022-03-31 DIAGNOSIS — M791 Myalgia, unspecified site: Secondary | ICD-10-CM | POA: Diagnosis not present
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
## 2022-03-31 NOTE — ED Notes (Signed)
Pt c/o blurred vision, seeing spots, dizziness, N/Vx2d.

## 2022-03-31 NOTE — Discharge Instructions (Addendum)
The CT of your head showed no acute findings.   A concussion is a very mild traumatic brain injury caused by a bump, jolt or blow to the head, most people recover quickly and fully. You can experience a wide variety of symptoms including:   - Confusion      - Difficulty concentrating       - Trouble remembering new info  - Headache      - Dizziness        - Fuzzy or blurry vision  - Fatigue      - Balance problems      - Light sensitivity  - Mood swings     - Changes in sleep or difficulty sleeping   To help these symptoms improve make sure you are getting plenty of rest, avoid screen time, loud music and strenuous mental activities. Avoid any strenuous physical activities, once your symptoms have resolved a slow and gradual return to activity is recommended. It is very important that you avoid situations in which you might sustain a second head injury as this can be very dangerous and life threatening. You cannot be medically cleared to return to normal activities until you have followed up with your primary doctor or a concussion specialist for reevaluation.
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
